# Patient Record
Sex: Male | Born: 1990 | Race: White | Hispanic: No | Marital: Single | Smoking: Never smoker
Health system: Southern US, Community
[De-identification: ages and names within clinical notes are randomized; demographics above are authoritative.]

---

## 2012-11-07 ENCOUNTER — Emergency Department (HOSPITAL_BASED_OUTPATIENT_CLINIC_OR_DEPARTMENT_OTHER)
Admission: EM | Admit: 2012-11-07 | Discharge: 2012-11-07 | Disposition: A | Discharge status: Home / Self Care | Payer: BC Managed Care – PPO | Attending: Emergency Medicine | Admitting: Emergency Medicine

## 2012-11-07 ENCOUNTER — Encounter (HOSPITAL_BASED_OUTPATIENT_CLINIC_OR_DEPARTMENT_OTHER): Payer: Self-pay | Admitting: *Deleted

## 2012-11-07 DIAGNOSIS — A6001 Herpesviral infection of penis: Secondary | ICD-10-CM | POA: Insufficient documentation

## 2012-11-07 DIAGNOSIS — A6002 Herpesviral infection of other male genital organs: Secondary | ICD-10-CM

## 2012-11-07 DIAGNOSIS — R21 Rash and other nonspecific skin eruption: Secondary | ICD-10-CM | POA: Insufficient documentation

## 2012-11-07 MED ORDER — ACYCLOVIR 400 MG PO TABS: 400.0000 mg | ORAL_TABLET | Freq: Three times a day (TID) | ORAL | Status: AC

## 2012-11-07 NOTE — ED Provider Notes (Signed)
   History    CSN: [id number] Arrival date & time 11/07/12  1612  First MD Initiated Contact with Patient 11/07/12 1626     Chief Complaint  Patient presents with  . Penile Discharge    Patient is a 22 y.o. male presenting with penile discharge. The history is provided by the patient.  Penile Discharge This is a new problem. The current episode started 2 days ago. The problem occurs constantly. Pertinent negatives include no abdominal pain. Nothing aggravates the symptoms. Nothing relieves the symptoms.   PMH - none    History  Substance Use Topics  . Smoking status: Never Smoker   . Smokeless tobacco: Not on file  . Alcohol Use: No    Review of Systems  Gastrointestinal: Negative for abdominal pain.  Genitourinary: Positive for discharge.  Skin: Positive for rash.    Allergies  Review of patient's allergies indicates no known allergies.  Home Medications   Current Outpatient Rx  Name  Route  Sig  Dispense  Refill  . acyclovir (ZOVIRAX) 400 MG tablet   Oral   Take 1 tablet (400 mg total) by mouth 3 (three) times daily.   28 tablet   0    BP 142/81  Pulse 95  Temp(Src) 98.4 F (36.9 C) (Oral)  Resp 16  Ht 6\' 2"  (1.88 m)  Wt 295 lb (133.811 kg)  BMI 37.86 kg/m2  SpO2 99% Physical Exam CONSTITUTIONAL: Well developed/well nourished HEAD: Normocephalic/atraumatic EYES: EOMI ENMT: Mucous membranes moist NECK: supple no meningeal signs ABDOMEN: soft, nontender, no rebound or guarding GU:no cva tenderness. Multiple small erythematous vesicles to penile glans.  No chancre.  No streaking or drainage.  No penile drainage noted.  No scrotal tenderness NEURO: Pt is awake/alert, moves all extremitiesx4 EXTREMITIES:  full ROM SKIN: warm, color normal PSYCH: no abnormalities of mood noted  ED Course  Procedures (including critical care time) Labs Reviewed  GC/CHLAMYDIA PROBE AMP   No results found. 1. 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  He reports recent unprotected sexual intercourse.  Given course of acyclovir for likely first outbreak of herpes  Joya Gaskins, MD 11/07/12 2352

## 2012-11-07 NOTE — ED Notes (Signed)
Pt c/o penile discharge and " red spots on penis x 2 days

## 2017-06-09 ENCOUNTER — Encounter (HOSPITAL_BASED_OUTPATIENT_CLINIC_OR_DEPARTMENT_OTHER): Payer: Self-pay

## 2017-06-09 ENCOUNTER — Emergency Department (HOSPITAL_BASED_OUTPATIENT_CLINIC_OR_DEPARTMENT_OTHER): Payer: BC Managed Care – PPO

## 2017-06-09 ENCOUNTER — Other Ambulatory Visit: Payer: Self-pay

## 2017-06-09 ENCOUNTER — Emergency Department (HOSPITAL_BASED_OUTPATIENT_CLINIC_OR_DEPARTMENT_OTHER)
Admission: EM | Admit: 2017-06-09 | Discharge: 2017-06-09 | Disposition: A | Discharge status: Home / Self Care | Payer: BC Managed Care – PPO | Attending: Physician Assistant | Admitting: Physician Assistant

## 2017-06-09 DIAGNOSIS — N50812 Left testicular pain: Secondary | ICD-10-CM | POA: Diagnosis present

## 2017-06-09 DIAGNOSIS — N492 Inflammatory disorders of scrotum: Secondary | ICD-10-CM | POA: Diagnosis not present

## 2017-06-09 DIAGNOSIS — R609 Edema, unspecified: Secondary | ICD-10-CM

## 2017-06-09 LAB — URINALYSIS, ROUTINE W REFLEX MICROSCOPIC
BILIRUBIN URINE: NEGATIVE
Glucose, UA: NEGATIVE mg/dL
Hgb urine dipstick: NEGATIVE
KETONES UR: 15 mg/dL — AB
Leukocytes, UA: NEGATIVE
Nitrite: NEGATIVE
Protein, ur: NEGATIVE mg/dL
SPECIFIC GRAVITY, URINE: 1.025 (ref 1.005–1.030)
pH: 6 (ref 5.0–8.0)

## 2017-06-09 MED ORDER — CLINDAMYCIN HCL 300 MG PO CAPS: 300.0000 mg | ORAL_CAPSULE | Freq: Three times a day (TID) | ORAL | 0 refills | Status: AC

## 2017-06-09 NOTE — ED Triage Notes (Signed)
Pt reports that Thursday he "pinched" his testicles. States he has developed a hematoma on his scrotum. Reports testicular pain, swelling, and hematoma/possible abscess noted to left side of scrotum.

## 2017-06-09 NOTE — Discharge Instructions (Signed)
Please read instructions below.  Keep your wound clean and covered. Apply warm compresses multiple times per day. You can take Advil/ibuprofen every 6 hours as needed for pain. Begin taking the antibiotic, Clindamycin, as prescribed until it is gone. Follow up with your primary care or urgent care for wound recheck in 2 days.  Return to the ER for fever, worsening redness, urinary symptoms, or new or worsening symptoms.

## 2017-06-09 NOTE — ED Notes (Signed)
ED Provider at bedside with RN for scrotal exam

## 2017-06-09 NOTE — ED Notes (Signed)
Patient transported to Ultrasound 

## 2017-06-09 NOTE — ED Notes (Signed)
Pt returned from US

## 2017-06-09 NOTE — ED Provider Notes (Signed)
[facility] EMERGENCY DEPARTMENT Provider Note   CSN: [id number] Arrival date & time: 06/09/17  1632     History   Chief Complaint Chief Complaint  Patient presents with  . Testicle Pain    HPI Wesley Walton is a 27 y.o. male without significant PMHx, presenting to ED with left-sided scrotal pain and swelling since Thursday. Pt states he was sitting on the couch on Thursday and accidentally "pinched" his testicle when he leaned while trying to put on his shoes. He states pain was minimal at the time.  He noticed later in the evening that he saw a small red bump on the left scrotum.  He states pain was also minimal at that time.  He states he began having worsening swelling and pain yesterday and today.  He reported to urgent care, however was recommended to report to the ED.  States he has had a small amount of drainage from an area on his scrotum.  Denies testicular pain, urinary symptoms, penile discharge, abdominal pain, fever, chills, or any other complaints.  No recent antibiotics or allergies to antibiotics.  The history is provided by the patient.    History reviewed. No pertinent past medical history.  There are no active problems to display for this patient.   History reviewed. No pertinent surgical history.     Home Medications    Prior to Admission medications   Medication Sig Start Date End Date Taking? Authorizing Provider  acyclovir (ZOVIRAX) 400 MG tablet Take 1 tablet (400 mg total) by mouth 3 (three) times daily. 11/07/12   Zadie Rhine, MD  clindamycin (CLEOCIN) 300 MG capsule Take 1 capsule (300 mg total) by mouth 3 (three) times daily for 7 days. 06/09/17 06/16/17  Robinson, [place] N, PA-C    Family History No family history on file.  Social History Social History   Tobacco Use  . Smoking status: Never Smoker  . Smokeless tobacco: Never Used  Substance Use Topics  . Alcohol use: Yes    Comment: social  . Drug use: No      Allergies   Patient has no known allergies.   Review of Systems Review of Systems  Constitutional: Negative for chills and fever.  Gastrointestinal: Negative for abdominal pain.  Genitourinary: Positive for scrotal swelling. Negative for discharge, dysuria, frequency, penile pain and testicular pain.  Skin: Positive for color change and wound.  All other systems reviewed and are negative.    Physical Exam Updated Vital Signs BP (!) 146/99 (BP Location: Right Arm)   Pulse (!) 127   Temp 99.5 F (37.5 C) (Oral)   Resp 20   Ht 6\' 3"  (1.905 m)   Wt (!) 163.3 kg (360 lb)   SpO2 98%   BMI 45.00 kg/m   Physical Exam  Constitutional: He appears well-developed and well-nourished.  HENT:  Head: Normocephalic and atraumatic.  Eyes: Conjunctivae are normal.  Pulmonary/Chest: Effort normal.  Abdominal: Soft. Bowel sounds are normal. There is no tenderness.  Genitourinary:  Genitourinary Comments: Exam performed with chaperone present.  Left scrotum with erythema and edema/induration. No fluctuance noted. Small pea-sized area of the proximal portion of the lateral scrotum that is open without any purulent drainage expressed.  No tenderness to testicles.  No penile tenderness or discharge.  Neurological: He is alert.  Skin: Skin is warm.  Psychiatric: He has a normal mood and affect. His behavior is normal.  Nursing note and vitals reviewed.    ED Treatments / Results  Labs (all labs ordered are listed, but only abnormal results are displayed) Labs Reviewed  URINALYSIS, ROUTINE W REFLEX MICROSCOPIC - Abnormal; Notable for the following components:      Result Value   Ketones, ur 15 (*)    All other components within normal limits    EKG  EKG Interpretation None       Radiology [place] Scrotom W/doppler  Addendum Date: 06/09/2017   ADDENDUM REPORT: 06/09/2017 18:12 ADDENDUM: Within the areas of scattered edema involving the scrotal wall, image 45, an area of complex  hypoechogenicity is identified measuring 11 mm in greatest size. This could represent more focal subcutaneous edema but a tiny complex fluid collection/abscess is not completely excluded; if patient has persistent symptoms at this site consider follow-up imaging. Findings discussed with [place] PA on 06/09/2017 at 1810 hours. Electronically Signed   By: Ulyses Southward M.D.   On: 06/09/2017 18:12   Result Date: 06/09/2017 CLINICAL DATA:  Swelling and pain for 2 days, palpable lump, trauma 2 days ago EXAM: SCROTAL ULTRASOUND DOPPLER ULTRASOUND OF THE TESTICLES TECHNIQUE: Complete ultrasound examination of the testicles, epididymis, and other scrotal structures was performed. Color and spectral Doppler ultrasound were also utilized to evaluate blood flow to the testicles. COMPARISON:  None FINDINGS: Right testicle Measurements: 4.5 x 2.5 x 3.3 cm. Normal morphology without mass or calcification. Internal blood flow present on color Doppler imaging. Left testicle Measurements: 3.8 x 2.3 x 2.3 cm. Normal morphology without mass or calcification. Internal blood flow present on color Doppler imaging. Right epididymis:  Was not visualized Left epididymis:  Normal in size and appearance. Hydrocele:  Absent bilaterally Varicocele:  Absent bilaterally Pulsed Doppler interrogation of both testes demonstrates normal low resistance arterial and venous waveforms bilaterally. Other: Within the LEFT hemiscrotum at the site of clinical concern, significant soft tissue swelling and scattered subcutaneous edema are identified. Associated hypervascularity on color Doppler imaging. No discrete fluid collection is definitely visualized. Several large areas of hyperechogenicity are identified without exerting mass effect, felt to represent edematous subcutaneous tissues rather than a discrete hematoma. No focal fluid collections identified. IMPRESSION: Unremarkable testes. Significant soft tissue swelling in the LEFT hemiscrotum with  associated hypervascularity and subcutaneous edema, without gross evidence of mass or abnormal fluid collection. Electronically Signed: By: Ulyses Southward M.D. On: 06/09/2017 17:56    Procedures Procedures (including critical care time)  Medications Ordered in ED Medications - No data to display   Initial Impression / Assessment and Plan / ED Course  I have reviewed the triage vital signs and the nursing notes.  Pertinent labs & imaging results that were available during my care of the patient were reviewed by me and considered in my medical decision making (see chart for details).     Patient with presentation consistent with cellulitis to left scrotum.  No fluctuant abscess appreciated on exam, however small area that appears to have been draining.  Scrotal ultrasound consistent with soft tissue edema of the scrotum, no fluid collection, no acute pathology involving testicles or epididymis.  Afebrile, not in distress.  UA negative for infection.  Will discharge with clindamycin and instructions to follow-up for recheck.  Discussed results, findings, treatment and follow up. Patient advised of return precautions. Patient verbalized understanding and agreed with plan.   Final Clinical Impressions(s) / ED Diagnoses   Final diagnoses:  Swelling  Cellulitis of scrotum    ED Discharge Orders        Ordered    clindamycin (CLEOCIN) 300  MG capsule  3 times daily     06/09/17 1852       Robinson, [place] N, PA-C 06/09/17 1854    Abelino DerrickMackuen, Courteney Lyn, MD 06/09/17 [phone]

## 2019-07-18 IMAGING — US US SCROTUM W/ DOPPLER COMPLETE
1 series · 13 of 25 positions shown · non-contrast
Comparison: None

ADDENDUM:
Within the areas of scattered edema involving the scrotal wall,
image 45, an area of complex hypoechogenicity is identified
measuring 11 mm in greatest size. This could represent more focal
subcutaneous edema but a tiny complex fluid collection/abscess is
not completely excluded; if patient has persistent symptoms at this
site consider follow-up imaging.

Findings discussed with Rickie PA on 06/09/2017 at 7873 hours.
CLINICAL DATA: Swelling and pain for 2 days, palpable lump, trauma
2 days ago
EXAM:
SCROTAL ULTRASOUND
DOPPLER ULTRASOUND OF THE TESTICLES
TECHNIQUE: Complete ultrasound examination of the testicles, epididymis, and
other scrotal structures was performed. Color and spectral Doppler
ultrasound were also utilized to evaluate blood flow to the
testicles.

[Series 1: us scrotum w/ doppler complete · 0.08mm/px · 13 of 53 slices shown]
[im 1/53]
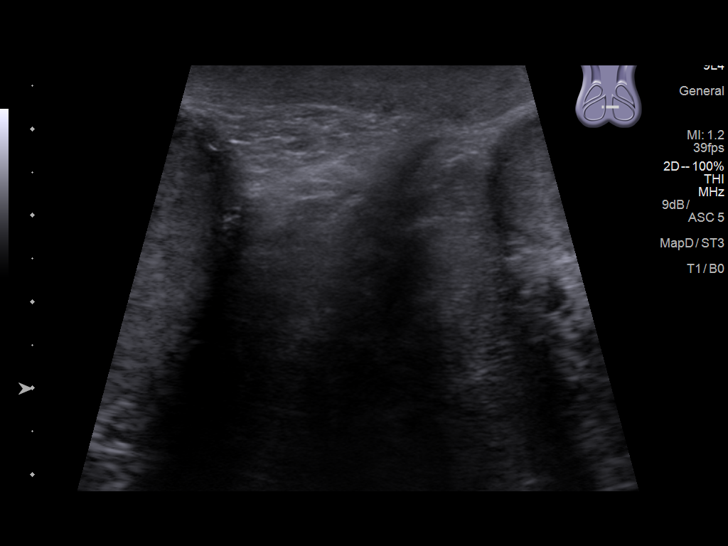
[im 5/53]
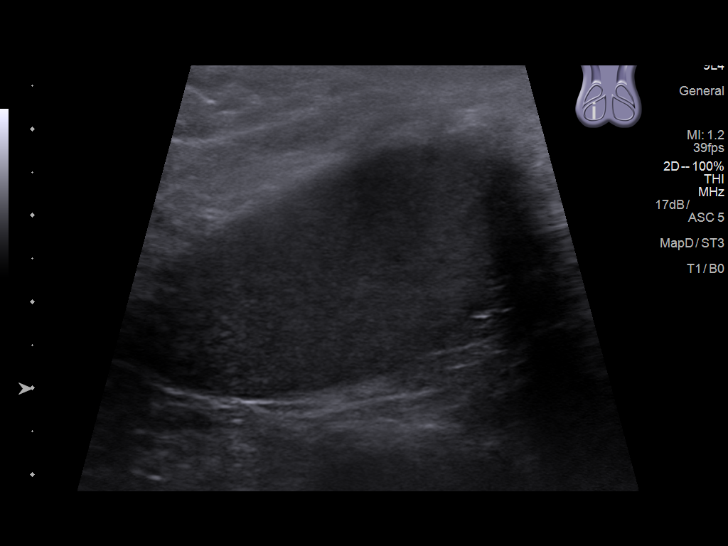
[im 9/53]
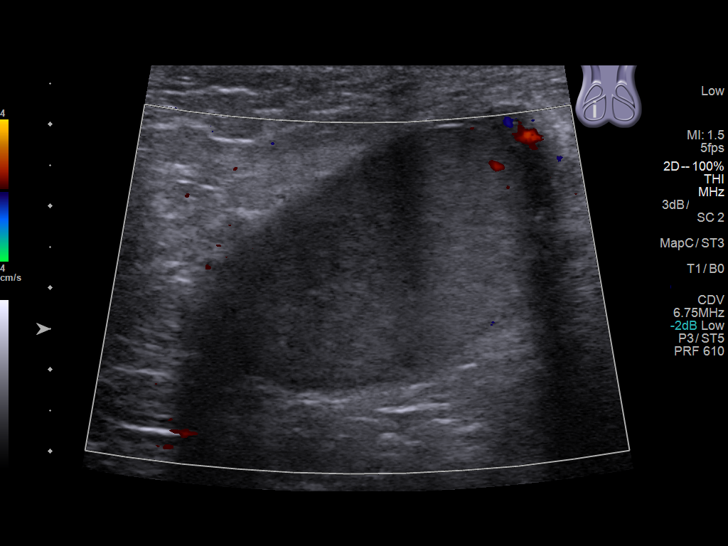
[im 14/53]
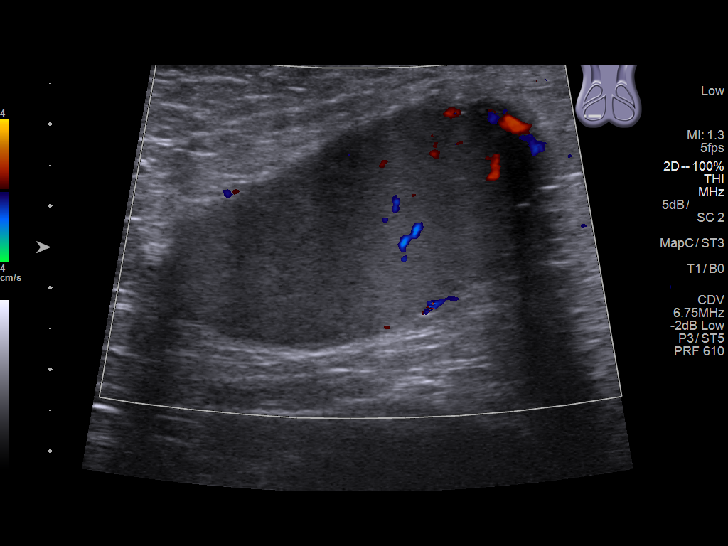
[im 18/53]
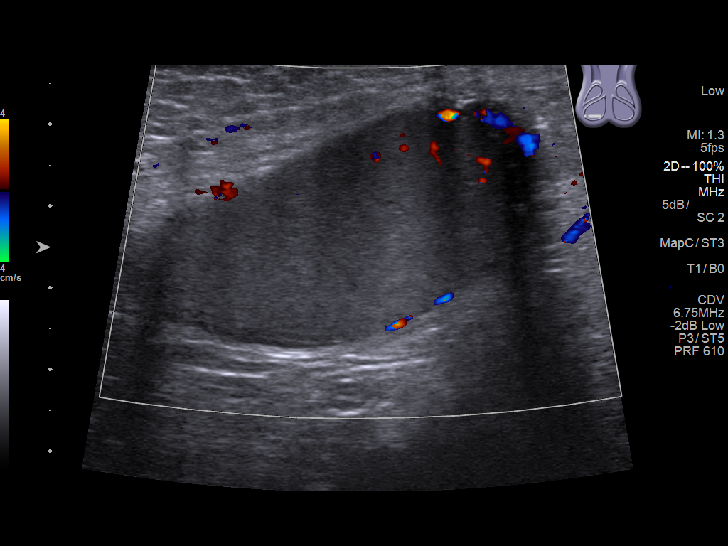
[im 22/53]
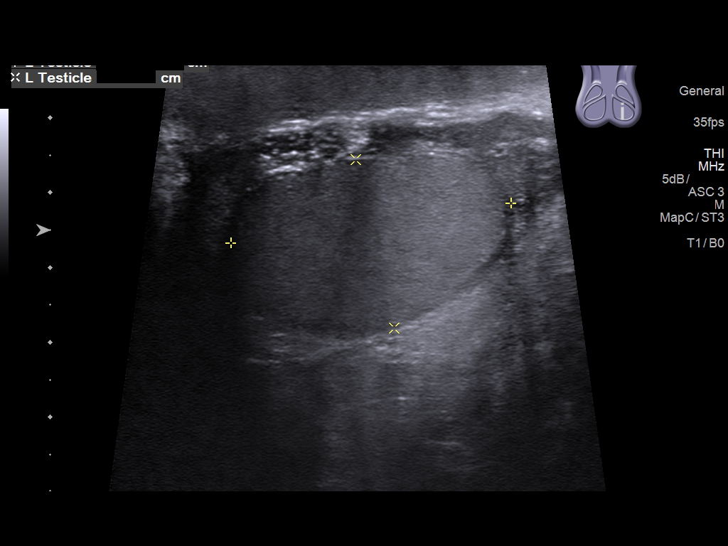
[im 27/53]
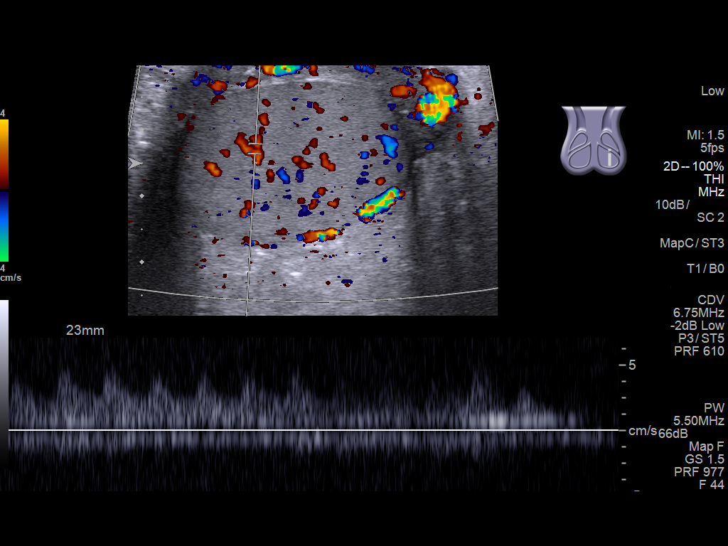
[im 31/53]
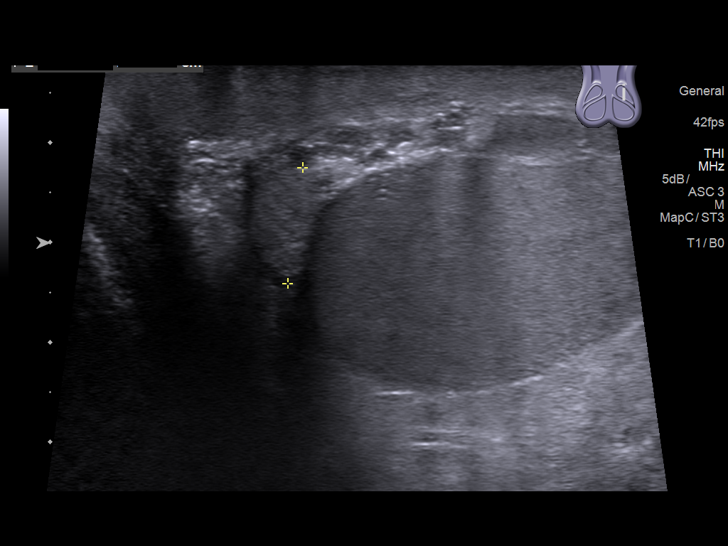
[im 35/53]
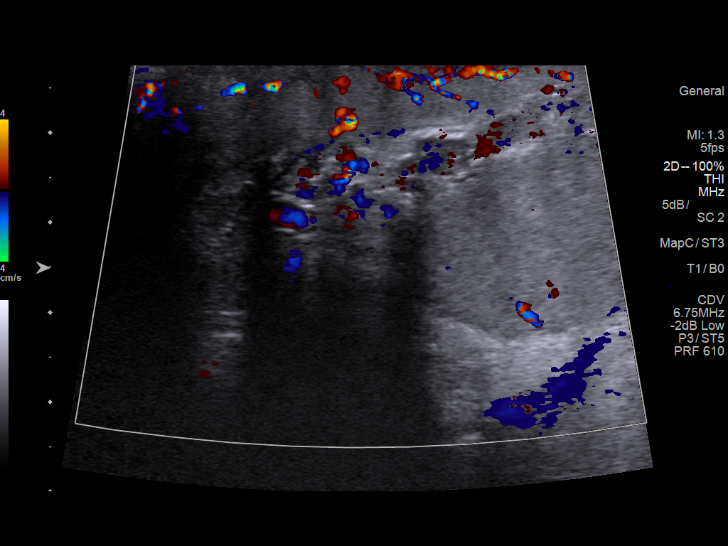
[im 40/53]
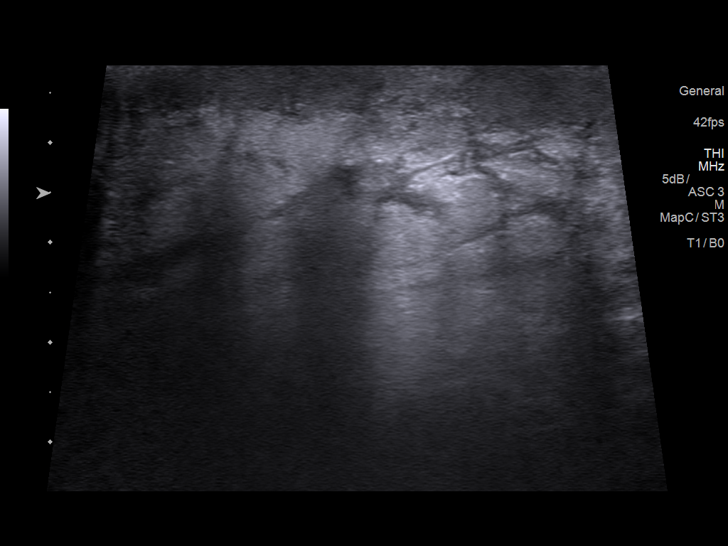
[im 44/53]
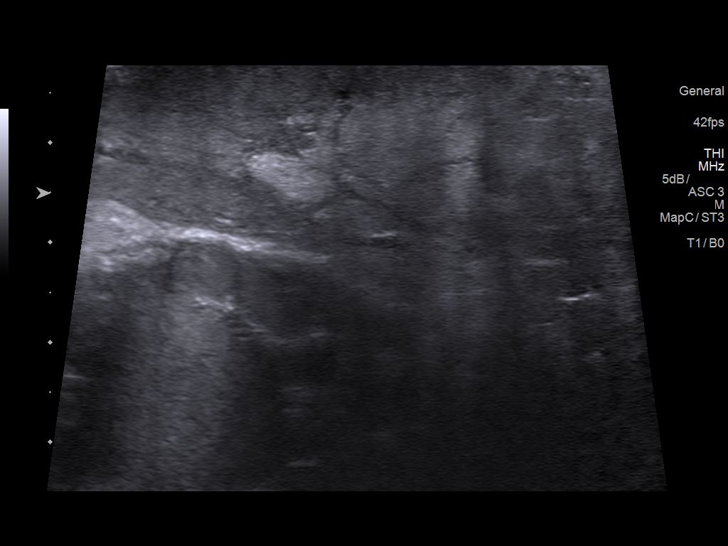
[im 48/53]
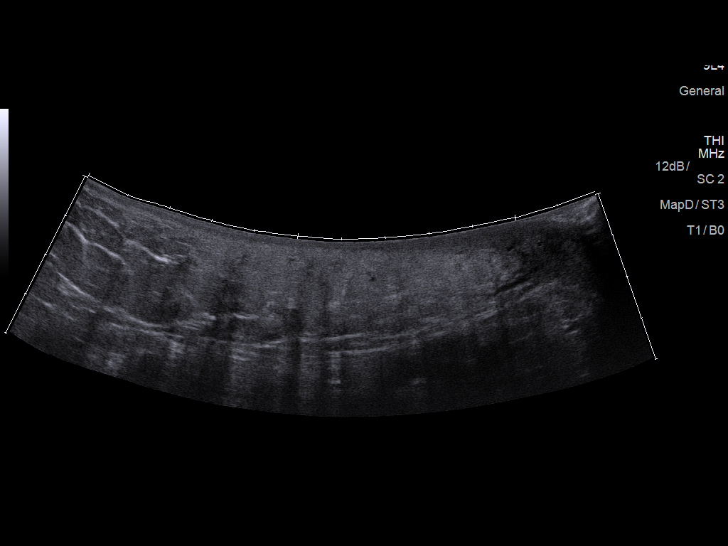
[im 53/53]
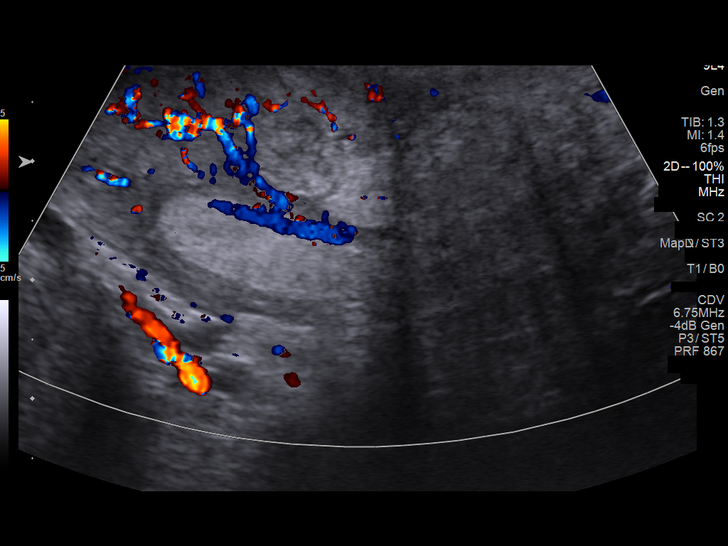

[13 of 25 positions shown; findings below may reference images not displayed]

FINDINGS: Right testicle

Measurements: 4.5 x 2.5 x 3.3 cm. Normal morphology without mass or
calcification. Internal blood flow present on color Doppler imaging.

Left testicle

Measurements: 3.8 x 2.3 x 2.3 cm. Normal morphology without mass or
calcification. Internal blood flow present on color Doppler imaging.

Right epididymis:  Was not visualized

Left epididymis:  Normal in size and appearance.

Hydrocele:  Absent bilaterally

Varicocele:  Absent bilaterally

Pulsed Doppler interrogation of both testes demonstrates normal low
resistance arterial and venous waveforms bilaterally.

Other: Within the LEFT hemiscrotum at the site of clinical concern,
significant soft tissue swelling and scattered subcutaneous edema
are identified. Associated hypervascularity on color Doppler
imaging. No discrete fluid collection is definitely visualized.
Several large areas of hyperechogenicity are identified without
exerting mass effect, felt to represent edematous subcutaneous
tissues rather than a discrete hematoma. No focal fluid collections
identified.
IMPRESSION: Unremarkable testes.

Significant soft tissue swelling in the LEFT hemiscrotum with
associated hypervascularity and subcutaneous edema, without gross
evidence of mass or abnormal fluid collection.

## 2019-09-25 ENCOUNTER — Ambulatory Visit: Payer: BC Managed Care – PPO | Attending: Family

## 2019-09-25 DIAGNOSIS — Z23 Encounter for immunization: Secondary | ICD-10-CM

## 2019-09-25 NOTE — Progress Notes (Signed)
   Covid-19 Vaccination Clinic  Name:  Wesley Walton    MRN: [id number] DOB: 1990-05-08  09/25/2019  Mr. Wesley Walton was observed post Covid-19 immunization for 15 minutes without incident. He was provided with Vaccine Information Sheet and instruction to access the V-Safe system.   Mr. Wesley Walton was instructed to call 911 with any severe reactions post vaccine: Marland Kitchen Difficulty breathing  . Swelling of face and throat  . A fast heartbeat  . A bad rash all over body  . Dizziness and weakness   Immunizations Administered    Name Date Dose VIS Date Route   Pfizer COVID-19 Vaccine 09/25/2019  2:20 PM 0.3 mL 06/25/2018 Intramuscular   Manufacturer: [employer], [employer]   Lot: [id number]   NDC: [address]-7

## 2019-10-15 ENCOUNTER — Ambulatory Visit: Payer: BC Managed Care – PPO | Attending: Family

## 2019-10-15 DIAGNOSIS — Z23 Encounter for immunization: Secondary | ICD-10-CM

## 2019-10-15 NOTE — Progress Notes (Signed)
   Covid-19 Vaccination Clinic  Name:  Ebony Rickel    MRN: [id number] DOB: 1990/11/29  10/15/2019  Mr. Darrow was observed post Covid-19 immunization for 15 minutes without incident. He was provided with Vaccine Information Sheet and instruction to access the V-Safe system.   Mr. Mimbs was instructed to call 911 with any severe reactions post vaccine: Marland Kitchen Difficulty breathing  . Swelling of face and throat  . A fast heartbeat  . A bad rash all over body  . Dizziness and weakness   Immunizations Administered    Name Date Dose VIS Date Route   Pfizer COVID-19 Vaccine 10/15/2019  1:04 PM 0.3 mL 06/25/2018 Intramuscular   Manufacturer: [employer], [employer]   Lot: [id number]   NDC: [address]-8

## 2019-10-21 ENCOUNTER — Ambulatory Visit: Payer: BC Managed Care – PPO
